# Patient Record
Sex: Male | Born: 1935 | Race: White | Hispanic: No | Smoking: Never smoker
Health system: Southern US, Community
[De-identification: ages and names within clinical notes are randomized; demographics above are authoritative.]

## PROBLEM LIST (undated history)

## (undated) DIAGNOSIS — I509 Heart failure, unspecified: Secondary | ICD-10-CM

## (undated) DIAGNOSIS — I10 Essential (primary) hypertension: Secondary | ICD-10-CM

## (undated) HISTORY — DX: Heart failure, unspecified: I50.9

## (undated) HISTORY — PX: COLON SURGERY: SHX602

## (undated) HISTORY — DX: Essential (primary) hypertension: I10

---

## 2003-08-06 ENCOUNTER — Other Ambulatory Visit: Payer: Self-pay

## 2005-06-11 ENCOUNTER — Inpatient Hospital Stay: Payer: Self-pay | Admitting: Internal Medicine

## 2005-06-11 ENCOUNTER — Other Ambulatory Visit: Payer: Self-pay

## 2006-06-18 ENCOUNTER — Other Ambulatory Visit: Payer: Self-pay

## 2006-06-18 ENCOUNTER — Emergency Department: Payer: Self-pay | Admitting: Emergency Medicine

## 2006-06-19 ENCOUNTER — Other Ambulatory Visit: Payer: Self-pay

## 2006-06-19 ENCOUNTER — Inpatient Hospital Stay: Payer: Self-pay | Admitting: Internal Medicine

## 2006-06-25 ENCOUNTER — Other Ambulatory Visit: Payer: Self-pay

## 2008-07-11 ENCOUNTER — Ambulatory Visit: Payer: Self-pay | Admitting: Internal Medicine

## 2010-05-30 ENCOUNTER — Ambulatory Visit: Payer: Self-pay | Admitting: Otolaryngology

## 2012-06-13 ENCOUNTER — Ambulatory Visit: Payer: Self-pay | Admitting: Cardiology

## 2012-06-13 DIAGNOSIS — I10 Essential (primary) hypertension: Secondary | ICD-10-CM

## 2012-06-13 LAB — BASIC METABOLIC PANEL
BUN: 49 mg/dL — ABNORMAL HIGH (ref 7–18)
Calcium, Total: 8.8 mg/dL (ref 8.5–10.1)
Chloride: 111 mmol/L — ABNORMAL HIGH (ref 98–107)
Creatinine: 1.97 mg/dL — ABNORMAL HIGH (ref 0.60–1.30)
EGFR (African American): 35 — ABNORMAL LOW
EGFR (Non-African Amer.): 30 — ABNORMAL LOW
Glucose: 90 mg/dL (ref 65–99)
Osmolality: 294 (ref 275–301)
Potassium: 4.5 mmol/L (ref 3.5–5.1)
Sodium: 141 mmol/L (ref 136–145)

## 2012-06-13 LAB — CBC WITH DIFFERENTIAL/PLATELET
Basophil #: 0 10*3/uL (ref 0.0–0.1)
Basophil %: 0.5 %
Eosinophil #: 0.4 10*3/uL (ref 0.0–0.7)
HCT: 36.1 % — ABNORMAL LOW (ref 40.0–52.0)
HGB: 12 g/dL — ABNORMAL LOW (ref 13.0–18.0)
Lymphocyte #: 0.7 10*3/uL — ABNORMAL LOW (ref 1.0–3.6)
Lymphocyte %: 8.2 %
MCHC: 33.1 g/dL (ref 32.0–36.0)
MCV: 90 fL (ref 80–100)
Monocyte %: 5.3 %
Neutrophil #: 6.4 10*3/uL (ref 1.4–6.5)
Neutrophil %: 80.4 %
RBC: 4 10*6/uL — ABNORMAL LOW (ref 4.40–5.90)
RDW: 15.2 % — ABNORMAL HIGH (ref 11.5–14.5)

## 2012-06-13 LAB — PROTIME-INR: Prothrombin Time: 12.7 secs (ref 11.5–14.7)

## 2012-06-16 ENCOUNTER — Ambulatory Visit: Payer: Self-pay | Admitting: Cardiology

## 2013-10-28 ENCOUNTER — Inpatient Hospital Stay: Payer: Self-pay | Admitting: Internal Medicine

## 2013-10-28 LAB — COMPREHENSIVE METABOLIC PANEL
ALT: 12 U/L (ref 12–78)
AST: 20 U/L (ref 15–37)
Albumin: 3.3 g/dL — ABNORMAL LOW (ref 3.4–5.0)
Alkaline Phosphatase: 142 U/L — ABNORMAL HIGH
Anion Gap: 7 (ref 7–16)
BILIRUBIN TOTAL: 0.9 mg/dL (ref 0.2–1.0)
BUN: 45 mg/dL — ABNORMAL HIGH (ref 7–18)
Calcium, Total: 8.7 mg/dL (ref 8.5–10.1)
Chloride: 105 mmol/L (ref 98–107)
Co2: 26 mmol/L (ref 21–32)
Creatinine: 2.29 mg/dL — ABNORMAL HIGH (ref 0.60–1.30)
EGFR (Non-African Amer.): 25 — ABNORMAL LOW
GFR CALC AF AMER: 29 — AB
Glucose: 140 mg/dL — ABNORMAL HIGH (ref 65–99)
Osmolality: 290 (ref 275–301)
Potassium: 4.5 mmol/L (ref 3.5–5.1)
SODIUM: 138 mmol/L (ref 136–145)
Total Protein: 7.4 g/dL (ref 6.4–8.2)

## 2013-10-28 LAB — CBC WITH DIFFERENTIAL/PLATELET
Basophil #: 0 10*3/uL (ref 0.0–0.1)
Basophil %: 0.7 %
EOS PCT: 3.1 %
Eosinophil #: 0.2 10*3/uL (ref 0.0–0.7)
HCT: 33.5 % — AB (ref 40.0–52.0)
HGB: 10.5 g/dL — ABNORMAL LOW (ref 13.0–18.0)
LYMPHS PCT: 6.4 %
Lymphocyte #: 0.3 10*3/uL — ABNORMAL LOW (ref 1.0–3.6)
MCH: 28.8 pg (ref 26.0–34.0)
MCHC: 31.4 g/dL — ABNORMAL LOW (ref 32.0–36.0)
MCV: 92 fL (ref 80–100)
Monocyte #: 0.4 x10 3/mm (ref 0.2–1.0)
Monocyte %: 7.2 %
NEUTROS ABS: 4.5 10*3/uL (ref 1.4–6.5)
Neutrophil %: 82.6 %
PLATELETS: 108 10*3/uL — AB (ref 150–440)
RBC: 3.66 10*6/uL — ABNORMAL LOW (ref 4.40–5.90)
RDW: 16.4 % — AB (ref 11.5–14.5)
WBC: 5.4 10*3/uL (ref 3.8–10.6)

## 2013-10-28 LAB — URINALYSIS, COMPLETE
BACTERIA: NONE SEEN
BILIRUBIN, UR: NEGATIVE
Blood: NEGATIVE
Glucose,UR: NEGATIVE mg/dL (ref 0–75)
Hyaline Cast: 3
KETONE: NEGATIVE
Leukocyte Esterase: NEGATIVE
Nitrite: NEGATIVE
Ph: 5 (ref 4.5–8.0)
Protein: NEGATIVE
RBC,UR: 1 /HPF (ref 0–5)
Specific Gravity: 1.012 (ref 1.003–1.030)

## 2013-10-28 LAB — APTT: ACTIVATED PTT: 33.6 s (ref 23.6–35.9)

## 2013-10-28 LAB — PROTIME-INR
INR: 1.3
PROTHROMBIN TIME: 15.6 s — AB (ref 11.5–14.7)

## 2013-10-29 LAB — CBC WITH DIFFERENTIAL/PLATELET
BASOS ABS: 0 10*3/uL (ref 0.0–0.1)
Basophil %: 0.5 %
Eosinophil #: 0 10*3/uL (ref 0.0–0.7)
Eosinophil %: 0.7 %
HCT: 31.7 % — AB (ref 40.0–52.0)
HGB: 10.2 g/dL — ABNORMAL LOW (ref 13.0–18.0)
LYMPHS ABS: 0.3 10*3/uL — AB (ref 1.0–3.6)
Lymphocyte %: 6.9 %
MCH: 29.7 pg (ref 26.0–34.0)
MCHC: 32.3 g/dL (ref 32.0–36.0)
MCV: 92 fL (ref 80–100)
Monocyte #: 0.4 x10 3/mm (ref 0.2–1.0)
Monocyte %: 8.9 %
NEUTROS ABS: 3.8 10*3/uL (ref 1.4–6.5)
NEUTROS PCT: 83 %
Platelet: 95 10*3/uL — ABNORMAL LOW (ref 150–440)
RBC: 3.45 10*6/uL — AB (ref 4.40–5.90)
RDW: 16.6 % — ABNORMAL HIGH (ref 11.5–14.5)
WBC: 4.6 10*3/uL (ref 3.8–10.6)

## 2013-10-29 LAB — BASIC METABOLIC PANEL
Anion Gap: 8 (ref 7–16)
BUN: 38 mg/dL — AB (ref 7–18)
CHLORIDE: 109 mmol/L — AB (ref 98–107)
CO2: 24 mmol/L (ref 21–32)
Calcium, Total: 8.8 mg/dL (ref 8.5–10.1)
Creatinine: 1.68 mg/dL — ABNORMAL HIGH (ref 0.60–1.30)
EGFR (African American): 42 — ABNORMAL LOW
GFR CALC NON AF AMER: 36 — AB
GLUCOSE: 128 mg/dL — AB (ref 65–99)
Osmolality: 292 (ref 275–301)
POTASSIUM: 4.5 mmol/L (ref 3.5–5.1)
Sodium: 141 mmol/L (ref 136–145)

## 2013-10-29 LAB — MAGNESIUM: Magnesium: 1.8 mg/dL

## 2013-10-30 LAB — CBC WITH DIFFERENTIAL/PLATELET
Basophil #: 0 10*3/uL (ref 0.0–0.1)
Basophil %: 0.3 %
EOS ABS: 0 10*3/uL (ref 0.0–0.7)
EOS PCT: 0.3 %
HCT: 23.6 % — ABNORMAL LOW (ref 40.0–52.0)
HGB: 7.6 g/dL — ABNORMAL LOW (ref 13.0–18.0)
LYMPHS ABS: 0.3 10*3/uL — AB (ref 1.0–3.6)
Lymphocyte %: 6.1 %
MCH: 29.7 pg (ref 26.0–34.0)
MCHC: 32.3 g/dL (ref 32.0–36.0)
MCV: 92 fL (ref 80–100)
MONO ABS: 0.6 x10 3/mm (ref 0.2–1.0)
Monocyte %: 10.7 %
Neutrophil #: 4.4 10*3/uL (ref 1.4–6.5)
Neutrophil %: 82.6 %
PLATELETS: 92 10*3/uL — AB (ref 150–440)
RBC: 2.56 10*6/uL — AB (ref 4.40–5.90)
RDW: 15.9 % — AB (ref 11.5–14.5)
WBC: 5.3 10*3/uL (ref 3.8–10.6)

## 2013-10-30 LAB — BASIC METABOLIC PANEL
Anion Gap: 6 — ABNORMAL LOW (ref 7–16)
BUN: 39 mg/dL — AB (ref 7–18)
Calcium, Total: 8.2 mg/dL — ABNORMAL LOW (ref 8.5–10.1)
Chloride: 105 mmol/L (ref 98–107)
Co2: 26 mmol/L (ref 21–32)
Creatinine: 1.92 mg/dL — ABNORMAL HIGH (ref 0.60–1.30)
EGFR (African American): 36 — ABNORMAL LOW
EGFR (Non-African Amer.): 31 — ABNORMAL LOW
Glucose: 158 mg/dL — ABNORMAL HIGH (ref 65–99)
OSMOLALITY: 287 (ref 275–301)
Potassium: 4.5 mmol/L (ref 3.5–5.1)
Sodium: 137 mmol/L (ref 136–145)

## 2013-10-30 LAB — HEMATOCRIT: HCT: 23.2 % — ABNORMAL LOW (ref 40.0–52.0)

## 2013-10-30 LAB — HEMOGLOBIN: HGB: 7.4 g/dL — ABNORMAL LOW (ref 13.0–18.0)

## 2013-10-31 LAB — CBC WITH DIFFERENTIAL/PLATELET
BASOS ABS: 0 10*3/uL (ref 0.0–0.1)
Basophil %: 0.3 %
Eosinophil #: 0.1 10*3/uL (ref 0.0–0.7)
Eosinophil %: 1.8 %
HCT: 24.7 % — ABNORMAL LOW (ref 40.0–52.0)
HGB: 8.3 g/dL — AB (ref 13.0–18.0)
LYMPHS ABS: 0.4 10*3/uL — AB (ref 1.0–3.6)
Lymphocyte %: 6.7 %
MCH: 30.4 pg (ref 26.0–34.0)
MCHC: 33.7 g/dL (ref 32.0–36.0)
MCV: 90 fL (ref 80–100)
MONO ABS: 0.5 x10 3/mm (ref 0.2–1.0)
Monocyte %: 8.5 %
NEUTROS ABS: 4.7 10*3/uL (ref 1.4–6.5)
Neutrophil %: 82.7 %
Platelet: 86 10*3/uL — ABNORMAL LOW (ref 150–440)
RBC: 2.74 10*6/uL — ABNORMAL LOW (ref 4.40–5.90)
RDW: 16.4 % — ABNORMAL HIGH (ref 11.5–14.5)
WBC: 5.7 10*3/uL (ref 3.8–10.6)

## 2013-11-01 LAB — CBC WITH DIFFERENTIAL/PLATELET
Basophil #: 0 10*3/uL (ref 0.0–0.1)
Basophil %: 0.5 %
EOS PCT: 2.1 %
Eosinophil #: 0.1 10*3/uL (ref 0.0–0.7)
HCT: 24.4 % — ABNORMAL LOW (ref 40.0–52.0)
HGB: 8.2 g/dL — AB (ref 13.0–18.0)
Lymphocyte #: 0.3 10*3/uL — ABNORMAL LOW (ref 1.0–3.6)
Lymphocyte %: 6.2 %
MCH: 30.3 pg (ref 26.0–34.0)
MCHC: 33.7 g/dL (ref 32.0–36.0)
MCV: 90 fL (ref 80–100)
Monocyte #: 0.4 x10 3/mm (ref 0.2–1.0)
Monocyte %: 8.1 %
NEUTROS ABS: 4.2 10*3/uL (ref 1.4–6.5)
Neutrophil %: 83.1 %
Platelet: 113 10*3/uL — ABNORMAL LOW (ref 150–440)
RBC: 2.72 10*6/uL — AB (ref 4.40–5.90)
RDW: 16.3 % — AB (ref 11.5–14.5)
WBC: 5 10*3/uL (ref 3.8–10.6)

## 2013-11-01 LAB — BASIC METABOLIC PANEL
Anion Gap: 8 (ref 7–16)
BUN: 56 mg/dL — ABNORMAL HIGH (ref 7–18)
CALCIUM: 8.8 mg/dL (ref 8.5–10.1)
Chloride: 103 mmol/L (ref 98–107)
Co2: 23 mmol/L (ref 21–32)
Creatinine: 2.21 mg/dL — ABNORMAL HIGH (ref 0.60–1.30)
EGFR (Non-African Amer.): 26 — ABNORMAL LOW
GFR CALC AF AMER: 30 — AB
GLUCOSE: 131 mg/dL — AB (ref 65–99)
OSMOLALITY: 286 (ref 275–301)
POTASSIUM: 4.4 mmol/L (ref 3.5–5.1)
SODIUM: 134 mmol/L — AB (ref 136–145)

## 2013-12-22 ENCOUNTER — Emergency Department: Payer: Self-pay | Admitting: Emergency Medicine

## 2014-10-12 NOTE — Op Note (Signed)
PATIENT NAME:  Philip Newton, [name] MR#:  [id number] DATE OF BIRTH:  [birth year]  DATE OF PROCEDURE:  06/16/2012  PRIMARY CARE PHYSICIAN: Dr. Dan HumphreysWalker.   PREPROCEDURE DIAGNOSIS: Sick sinus syndrome and elective replacement indication.   PROCEDURE: Dual-chamber pacemaker generator change out.   POSTOPERATIVE DIAGNOSIS: Atrial pacing with ventricular sensing.    INDICATION: The patient is an 81 year old gentleman with known history of dual-chamber pacemaker for sick sinus syndrome and sinus bradycardia. Recent pacemaker interrogation has shown the pacemaker was at elective replacement indication.    Procedure: Risks, benefits, and alternatives of the pacemaker generator change out were explained to the patient and informed written consent was obtained.  He was brought to the operating room in a fasting state.  The left pectoral region was prepped and draped in the usual sterile manner.  Anesthesia was obtained with 1% Xylocaine locally.  A 6-cm incision was performed over the left pectoral region.  The old pacemaker generator was retrieved by electrocautery and blunt dissection.  The leads were disconnected and interrogated and connected to a new dual-chamber pacemaker generator (Medtronic Adapta [id number]).  The pacemaker pocket was irrigated with genatmycin  solution.  New pacemaker generator was positioned in the pocket.  The pocket was closed with 2-0 and 4-0 Vicryl, respectively.  Steri-Strips and pressure dressing were applied.   ____________________________ Marcina MillardAlexander Merdis Snodgrass, MD ap:th D: 06/16/2012 13:47:33 ET T: 06/16/2012 22:03:22 ET JOB#: [id number]  cc: Marcina MillardAlexander Kiani Wurtzel, MD, <Dictator> Marcina MillardALEXANDER Nikea Settle MD ELECTRONICALLY SIGNED 06/22/2012 11:35

## 2014-10-13 NOTE — H&P (Signed)
PATIENT NAME:  Philip Newton, Philip Newton MR#:  [id number] DATE OF BIRTH:  Nov 26, 1925  DATE OF ADMISSION:  10/28/2013  PRIMARY CARE PHYSICIAN: John B. Danne HarborWalker III, MD   REFERRING PHYSICIAN: Dr. Zenda AlpersWebster.   CHIEF COMPLAINT: Fall.   HISTORY OF PRESENT ILLNESS: The patient is an 81 year old pleasant Caucasian male with past medical history of coronary artery disease status post CABG, hypertension, diabetes mellitus, colon cancer status post resection of the colon, who is brought into the ER via EMS after he sustained a fall. The patient tripped on his shoe lace and he fell and was complaining of right hip pain. The patient called his son who lives next door after he fell down. As the patient was having hip pain, the son called EMS and they have brought him into the ER. X-ray has revealed right hip fracture. Orthopedic consult was placed to Dr. Martha ClanKrasinski regarding the hip fracture and hospitalist team is called to admit the patient. During my examination, the patient is resting comfortably. Denies any chest pain, shortness of breath. Denies any dizziness or loss of consciousness. He reports that he fell because he tripped on the shoe lace. He was seen by Dr. Gwen PoundsKowalski recently and next appointment is on the 12th of this month. Hip pain is well-controlled the pain medicine. No family members at bedside. Denies any other complaints.   PAST MEDICAL HISTORY: Coronary artery disease status post CABG, colon cancer status post resection, hypertension, diabetes mellitus, arthritis, gout.   PAST SURGICAL HISTORY: Pacemaker placement, colon cancer resection, coronary artery bypass grafting in 2007.  ALLERGIES: ALLERGIC TO CORTISONE, ACE INHIBITORS, ALLOPURINOL, ASPIRIN, ZOCOR.   PSYCHOSOCIAL HISTORY: Lives at home, lives alone. No history of smoking, alcohol or illicit drug usage. Son lives next door.   FAMILY HISTORY: Diabetes runs in his family.   HOME MEDICATIONS: Imdur 30 mg p.o. once daily, hydralazine 25 mg p.o.  b.i.d., furosemide 20 mg p.o. once daily noontime, Coreg 25 mg p.o. b.i.d.   REVIEW OF SYSTEMS: CONSTITUTIONAL: Denies any fever, fatigue, weakness.  EYES: Denies blurry vision, double vision, glaucoma, cataracts.  EARS, NOSE, THROAT: Denies epistaxis, discharge, snoring, postnasal drip.  RESPIRATORY: Denies cough, COPD, no hemoptysis.  CARDIOVASCULAR: No chest pain, palpitations, syncope.  GASTROINTESTINAL: Denies nausea, vomiting, diarrhea, abdominal pain.  GENITOURINARY: Denies hematuria, dysuria. ENDOCRINE: Denies polyuria, nocturia, thyroid problems. Has diabetes mellitus.  HEMATOLOGIC/LYMPHATIC: No anemia, easy bruising, bleeding.  INTEGUMENTARY: No acne, rash, lesions.  MUSCULOSKELETAL: Complaining of right hip pain after fall prior to his ER arrival. Has history of gout.  NEUROLOGIC: Denies any vertigo, ataxia, dementia, headache.  PSYCHIATRIC: No ADD, OCD, insomnia, anxiety.   PHYSICAL EXAMINATION: VITAL SIGNS: Temperature 98.1, pulse 82, respirations 16, blood pressure 146/83, pulse oximetry 96%.  GENERAL APPEARANCE: Not in acute distress. Moderately built and nourished.  HEENT: Normocephalic, atraumatic. Pupils are equally reacting to light and accommodation icterus. No conjunctival injection. No sinus tenderness. No postnasal drip. Moist mucous membranes.  NECK: Supple. No JVD. No thyromegaly. Range of motion is intact.  LUNGS: Clear to auscultation bilaterally. No accessory muscle use and no anterior chest wall tenderness on palpation.  CARDIAC: S1, S2 normal. Regular rate and rhythm. Positive murmur.  GASTROINTESTINAL: Soft. Bowel sounds are positive in all 4 quadrants. Nontender, nondistended. No hepatosplenomegaly. No masses felt.  NEUROLOGIC: Awake, alert, oriented x3. Following all verbal commands. Motor and sensory are grossly intact except the right hip area which is tender. Reflexes are 2+.  MUSCULOSKELETAL: Right hip area is externally rotated, tender to touch.  Other  joints are intact. No acute exacerbation of gout.  SKIN: Warm to touch. Normal turgor. No rashes. No lesions.  EXTREMITIES: Trace edema. No cyanosis. No clubbing.  SKIN: Warm to warm to touch. Normal turgor. No rashes. No lesions.  PSYCHIATRIC: Normal mood and affect.   LABORATORIES AND IMAGING STUDIES: Glucose 140, BUN 45, creatinine 2.29, sodium, potassium and chloride are normal. GFR 29, anion gap and serum osmolality are normal. LFTs: Albumin 3.3, alkaline phosphatase 142. The rest of the LFTs are normal. WBC 5.4, hemoglobin 10.5, hematocrit 33.5, platelets are 108,000. A 12-lead EKG: Paced rhythm at 69, right bundle branch block. Nonspecific ST-T-wave changes. Chest x-ray, portable: Stable cardiomegaly without evidence of failure. No acute cardiopulmonary abnormality. Right hip x-ray: Acute intertrochanteric right femur fracture.   ASSESSMENT AND PLAN: An 82 year old Caucasian male presenting to the ER with a chief complaint of right hip pain after he sustained a fall. Will be admitted with the following assessment and plan:  1. Right hip fracture. The patient is kept n.p.o. except for medications. Pain management by orthopedics, Dr. Martha Clan. Cardiology consult is placed to Dr. Gwen Pounds for preoperative clearance from cardiac standpoint, which is pending at this time.  2. History of coronary artery disease, status post CABG. The patient will be monitored on telemetry, continue Imdur and Coreg. Cardiology consult is placed to Dr. Gwen Pounds regarding preoperative clearance from cardiology standpoint which is pending.  3. Diabetes mellitus. Continue sliding scale insulin as the patient is n.p.o.  4. Hypertension. Resume home medications including hydralazine and Coreg and up-titrate on an as-needed basis.  5. History of colon cancer status post resection. Currently under remission.  6. Will provide gastrointestinal prophylaxis.  7. Deep vein thrombosis prophylaxis with SCDs.  8. He is full code.  Son is the medical power of attorney.   Diagnosis and plan of care was discussed in detail with the patient. He is aware of the plan.   TOTAL TIME SPENT ON ADMISSION: 50 minutes.   Transfer the patient to Dr. Yates Decamp from [facility] in a.m.     ____________________________ Ramonita Lab, MD ag:lm D: 10/28/2013 04:05:10 ET T: 10/28/2013 05:44:25 ET JOB#: [id number]  cc: Ramonita Lab, MD, <Dictator> Ramonita Lab MD ELECTRONICALLY SIGNED 10/29/2013 4:45

## 2014-10-13 NOTE — Op Note (Signed)
PATIENT NAME:  Philip Newton, Philip Newton MR#:  [id number] DATE OF BIRTH:  11/20/1925  DATE OF PROCEDURE:  10/29/2013  POSTOPERATIVE DIAGNOSIS: Right intertrochanteric hip fracture.   POSTOPERATIVE DIAGNOSIS: Right intertrochanteric hip fracture.   PROCEDURE: Intramedullary fixation for right intertrochanteric hip fracture.   SURGEON: Juanell Fairly, MD   ANESTHESIA: General.   ESTIMATED BLOOD LOSS: 50 mL.   COMPLICATIONS: None.   SPECIMENS: None.   INDICATIONS FOR PROCEDURE: The patient is an 81 year old male who sustained a mechanical fall at home fracturing his right hip. Given his ambulatory status at baseline and the degree of injury, I recommended intramedullary fixation for the right intertrochanteric hip fracture. I discussed the risks and benefits of surgery with the patient and his son prior to the operation. They understand the risks include infection, bleeding, nerve or blood vessel injury, persistent hip pain, change in lower extremity rotation, leg length discrepancy and hardware failure, screw cut-out, and the need for further surgery. Medical complications include, but are not limited to, DVT and pulmonary embolism, myocardial infarction, stroke, pneumonia, respiratory failure, and death.   DESCRIPTION OF PROCEDURE: The patient was marked with the word yes over the right hip according to the hospital's right site protocol. He was brought to the operating room where he underwent general anesthesia with an LMA. He was then positioned supine on a fracture table. The right leg was placed in a leg holder and the left leg placed in a left hemi-lithotomy position. All bony prominences were adequately padded. A timeout was performed to verify the patient's name, date of birth, medical record number, correct site of surgery, and correct procedure to be performed. It was also used to verify the patient received antibiotics and that all appropriate instruments, implants, and radiographic studies  were available in the room. Once all in attendance were in agreement, the case began.   AP and lateral images of the fracture were taken using C-arm. Traction and external rotation were then applied. The fracture reduction was confirmed on AP and lateral C-arm images. Once the fracture was well aligned, the patient was prepped and draped in a sterile fashion. The proposed incisions were based upon bony landmarks and drawn out with a surgical marker. The landmarks were confirmed using C-arm.   A #10 blade was used to make a lateral incision in line with the femur, superior to the greater trochanter. The deep fascia was then incised with a deep #10 blade. The abductor muscle was bluntly split in line with its fibers to allow for palpation of the tip of the greater trochanter. A threaded guide pin was then advanced into the proximal femur. Its position was confirmed on AP and lateral C-arm images. This was then overdrilled using a 15.5 mm proximal reamer. A long ball-tip guidewire was then advanced into the intramedullary canal. An 11 x 180 short nail was then advanced over this ball-tip guidewire into position. The ball-tip guidewire was then removed. A drill sleeve for the lag screw was then placed alongside the femur. The incision was made in the skin and then the deep fascia to allow for approximation with the lateral cortex of the femur. This guide pin was then advanced across the fracture and into the subcortical bone of the femoral head. Its position was confirmed on AP and lateral C-arm images. This was measured for depth and measured to be 110 mm in length. The lag screw drill was then set for 110 mm in length and advanced into position. A 110 mm  lag screw was then advanced by hand over the guide pin into position. Its final position again confirmed on AP and lateral C-arm images. A tip apex distance of less than 25 mm was achieved. The traction was then let off the right leg and the set screw was  tightened into position and then loosened a quarter turn to allow for compression at the fracture site after compression at the fracture had been achieved using the compression wheel.  Attention was then turned to the placement of the distal interlocking screw. The distal interlocking screw guide was placed through the Affixus nail guide handle. Another stab incision was made to allow for the guide sleeve to approximate the lateral cortex of the femur. This drill was then used to create a bicortical hole. A depth gauge was used to measure its depth.  A size 40 mm distal interlocking screw was used. The guide handle for the Affixus nail was then removed. Final C-arm images of the construct were taken. All wounds were copiously irrigated. The deep fascia of the proximal incision which is approximately 3 cm in length was closed with an interrupted 0 Vicryl. The subcutaneous tissue of all 3 incisions was closed with 2-0 Vicryl and the skin approximated with staples. A dry sterile dressing was applied. The patient was then awoken and transferred to the PACU in stable condition. I was scrubbed and present for the entire case and all sharp and instrument counts were correct at the conclusion of the case. I spoke with the patient's son by phone as he is waiting in the patient's room down on the orthopedic floor  to let him know the case had gone without complication and the patient was stable in the recovery room.    ____________________________ Philip DevoidKevin L. Beonka Amesquita, MD klk:dd D: 10/29/2013 11:48:28 ET T: 10/29/2013 23:33:50 ET JOB#: [id number]  cc: Philip DevoidKevin L. Dex Blakely, MD, <Dictator> Philip DevoidKEVIN L Larnce Schnackenberg MD ELECTRONICALLY SIGNED 11/07/2013 7:53

## 2014-10-13 NOTE — Discharge Summary (Signed)
Dates of Admission and Diagnosis:  Date of Admission 28-Oct-2013   Date of Discharge 02-Nov-2013   Admitting Diagnosis acute intertrochanteric fracture of right femur   Final Diagnosis acute intertrochanteric fracture of right femur, s/p intramedulary fixation on 10/29/13, Anemia, CKD, DM, HTN. CAD, s/p CABG, s/p pacemaker   Discharge Diagnosis 1 acute intertrochanteric fracture of right femur, s/p intramedulary fixation on 10/29/13   2 Anemia   3 Chronic Kidney disease   4 Diabetes   5 CAD, s/p CABG   6 HTN    Chief Complaint/History of Present Illness 81 year old male presented with hip pain after he tripped and fell at home. X ray in ED revealed acute intertrochanteric fracture of right femur.   Allergies:  Allopurinol: Unknown  Zocor: Other  Aspirin Adult Low Strength: Other  Ace Inhibitors: Unknown  Cortisone: Unknown    Hepatic:  09-May-15 02:17   Bilirubin, Total 0.9  Alkaline Phosphatase  142 (45-117 NOTE: New Reference Range 05/12/13)  SGPT (ALT) 12  SGOT (AST) 20  Total Protein, Serum 7.4  Albumin, Serum  3.3  Routine Chem:  09-May-15 02:17   Glucose, Serum  140  BUN  45  Creatinine (comp)  2.29  Sodium, Serum 138  Potassium, Serum 4.5  Chloride, Serum 105  CO2, Serum 26  Calcium (Total), Serum 8.7  Anion Gap 7  Osmolality (calc) 290  eGFR (African American)  29  eGFR (Non-African American)  25 (eGFR values <42m/min/1.73 m2 may be an indication of chronic kidney disease (CKD). Calculated eGFR is useful in patients with stable renal function. The eGFR calculation will not be reliable in acutely ill patients when serum creatinine is changing rapidly. It is not useful in  patients on dialysis. The eGFR calculation may not be applicable to patients at the low and high extremes of body sizes, pregnant women, and vegetarians.)  10-May-15 04:12   Creatinine (comp)  1.68  eGFR (Non-African American)  36 (eGFR values <644mmin/1.73 m2 may be an  indication of chronic kidney disease (CKD). Calculated eGFR is useful in patients with stable renal function. The eGFR calculation will not be reliable in acutely ill patients when serum creatinine is changing rapidly. It is not useful in  patients on dialysis. The eGFR calculation may not be applicable to patients at the low and high extremes of body sizes, pregnant women, and vegetarians.)  11-May-15 04:18   Creatinine (comp)  1.92  eGFR (Non-African American)  31 (eGFR values <6035min/1.73 m2 may be an indication of chronic kidney disease (CKD). Calculated eGFR is useful in patients with stable renal function. The eGFR calculation will not be reliable in acutely ill patients when serum creatinine is changing rapidly. It is not useful in  patients on dialysis. The eGFR calculation may not be applicable to patients at the low and high extremes of body sizes, pregnant women, and vegetarians.)  13-May-15 06:12   Glucose, Serum  131  BUN  56  Creatinine (comp)  2.21  Sodium, Serum  134  Potassium, Serum 4.4  Chloride, Serum 103  CO2, Serum 23  Calcium (Total), Serum 8.8  Anion Gap 8  Osmolality (calc) 286  eGFR (African American)  30  eGFR (Non-African American)  26 (eGFR values <57m8mn/1.73 m2 may be an indication of chronic kidney disease (CKD). Calculated eGFR is useful in patients with stable renal function. The eGFR calculation will not be reliable in acutely ill patients when serum creatinine is changing rapidly. It is not useful in  patients  on dialysis. The eGFR calculation may not be applicable to patients at the low and high extremes of body sizes, pregnant women, and vegetarians.)  Routine Hem:  09-May-15 02:17   WBC (CBC) 5.4  RBC (CBC)  3.66  Hemoglobin (CBC)  10.5  Hematocrit (CBC)  33.5  Platelet Count (CBC)  108  MCV 92  MCH 28.8  MCHC  31.4  RDW  16.4  Neutrophil % 82.6  Lymphocyte % 6.4  Monocyte % 7.2  Eosinophil % 3.1  Basophil % 0.7   Neutrophil # 4.5  Lymphocyte #  0.3  Monocyte # 0.4  Eosinophil # 0.2  Basophil # 0.0 (Result(s) reported on 28 Oct 2013 at 02:35AM.)  10-May-15 04:12   Hemoglobin (CBC)  10.2  Hematocrit (CBC)  31.7  11-May-15 04:18   Hemoglobin (CBC)  7.6  Hematocrit (CBC)  23.6    07:33   Hemoglobin (CBC)  7.4 (Result(s) reported on 30 Oct 2013 at 07:45AM.)  Hematocrit (CBC)  23.2 (Result(s) reported on 30 Oct 2013 at 07:45AM.)  12-May-15 05:51   Hemoglobin (CBC)  8.3  Hematocrit (CBC)  24.7  13-May-15 06:12   WBC (CBC) 5.0  RBC (CBC)  2.72  Hemoglobin (CBC)  8.2  Hematocrit (CBC)  24.4  Platelet Count (CBC)  113  MCV 90  MCH 30.3  MCHC 33.7  RDW  16.3  Neutrophil % 83.1  Lymphocyte % 6.2  Monocyte % 8.1  Eosinophil % 2.1  Basophil % 0.5  Neutrophil # 4.2  Lymphocyte #  0.3  Monocyte # 0.4  Eosinophil # 0.1  Basophil # 0.0 (Result(s) reported on 01 Nov 2013 at 08:30AM.)   PERTINENT RADIOLOGY STUDIES: XRay:    09-May-15 01:57, Hip Right Complete  Hip Right Complete   REASON FOR EXAM:    fall  COMMENTS:       PROCEDURE: DXR - DXR HIP RIGHT COMPLETE  - Oct 28 2013  1:57AM     CLINICAL DATA:  Right hip pain after fall    EXAM:  RIGHT HIP - COMPLETE 2+ VIEW    COMPARISON:  None.    FINDINGS:  Acute intertrochanteric right femur fracture. There is distraction  of fracture fragments and mild posterior impaction. The right  femoral head remains located.    No pelvic ring fracture. No proximal left femur fracture or left hip  dislocation. Extensive atherosclerosis.     IMPRESSION:  Acute intertrochanteric right femur fracture.      Electronically Signed    By: Jorje Guild M.D.    On: 10/28/2013 02:07         Verified By: Gilford Silvius, M.D.,    10-May-15 10:05, Femur Right  Femur Right   REASON FOR EXAM:    Fracture reduction/fixation  COMMENTS:       PROCEDURE: DXR - DXR FEMUR RIGHT  - Oct 29 2013 10:05AM     CLINICAL DATA:  Intertrochanteric  fracture    EXAM:  DG C-ARM 61-120 MIN; RIGHT FEMUR - 2 VIEW    :  COMPARISON:    10/28/2013  FINDINGS:  Four intraoperative fluoroscopic spot images document placement of  IM rod with interlocking sliding screw across the IT fracture. There  is also a distal interlocking screw.     IMPRESSION:  Internal fixation of intertrochanteric fracture      Electronically Signed    By: Arne Cleveland M.D.    On: 10/29/2013 10:12  Verified By: Kandis Cocking, M.D.,    10-May-15 11:21, Hip Right One View  Hip Right One View   REASON FOR EXAM:    post op  COMMENTS:   Bedside (portable):Y    PROCEDURE: DXR - DXR HIP RIGHT ONE VIEW  - Oct 29 2013 11:21AM     CLINICAL DATA:  rt hip ORIF / post op    EXAM:  RIGHT HIP - 1 VIEW    COMPARISON:  DG PELVIS 1-2V dated 10/29/2013    FINDINGS:  Patient is status post open reduction internal fixation right hip.  Hardware and native osseous structures intact. Near anatomical  alignment. Postsurgical changes within the soft tissues.     IMPRESSION:  Patient is status post ORIF      Electronically Signed    By: Margaree Mackintosh M.D.    On: 10/29/2013 11:51         Verified By: Mikki Santee, M.D., MD   Pertinent Past History:  Pertinent Past History Hypertension CAD, s/p CABG, s/p pacemaker Diabetes Chronic kidney disease   Hospital Course:  Hospital Course Patient was admitted for acute intertrochanteric fracture of right femur. Patient underwent intramedulary fixation on 10/29/13. Hemoglobin dropped under 8 and patient received transfusion with PRBCs. H/H remained stable after transfusion. Patient received Ancef & Clindamycin.  Cardiac medications were continued and patient was followed by cardiology during his stay. He received sliding scale insulin coverage for diabetes. Chronic kidney disease: creatinine remained stable, 2.2 prior to discharge. He received physical therapy and ambulated with a walker prior to  discharge. Exam: NAD. HENT: no icterus, no JVD. Chest: bilateral clear to auscultation. CVS: RRR, no tachycardia. Abdomen: soft, nontender, +BS. Ext: no ankle edema. Neuro: Alert, oriented.   Condition on Discharge Stable   DISCHARGE INSTRUCTIONS HOME MEDS:  Medication Reconciliation: Patient's Home Medications at Discharge:     Medication Instructions  carvedilol 25 mg oral tablet  1  orally 2 times a day    furosemide 20 mg oral tablet  1  orally once a day at noon   imdur 30 mg oral tablet, extended release  1 tab(s) orally once a day (in the morning)   hydralazine 25 mg oral tablet   orally 2 times a day   acetaminophen 325 mg oral tablet  2 tab(s) orally every 4 hours, As needed, -for temperature >100.5   magnesium hydroxide 8% oral suspension  30 milliliter(s) orally 2 times a day, As needed, constipation   enoxaparin  30 milligram(s) subcutaneous every 24 hours   calcium-vitamin d 500 mg-200 intl units oral tablet  1 tab(s) orally 2 times a day (with meals)   docusate sodium 100 mg oral capsule  1 cap(s) orally 2 times a day   ferrous sulfate 325 mg (65 mg elemental iron) oral tablet  1 tab(s) orally 2 times a day (with meals)   insulin aspart 100 units/ml subcutaneous solution   subcutaneous      Physician's Instructions:  Diet Low Sodium  Renal Diet   Activity Limitations As tolerated   Return to Work Not Applicable   Time frame for Follow Up Appointment 1-2 weeks  after rehab, Dr. Gilford Rile   Time frame for Follow Up Appointment 1-2 weeks  Ortho   Time frame for Follow Up Appointment 1-2 weeks  Nephrologist, Dr. Candiss Norse   Electronic Signatures: Glendon Axe (MD)  (Signed 14-May-15 13:55)  Authored: ADMISSION DATE AND DIAGNOSIS, CHIEF COMPLAINT/HPI, Allergies, PERTINENT LABS, PERTINENT RADIOLOGY STUDIES, PERTINENT PAST  HISTORY, HOSPITAL COURSE, DISCHARGE INSTRUCTIONS HOME MEDS, PATIENT INSTRUCTIONS   Last Updated: 14-May-15 13:55 by Glendon Axe (MD)

## 2014-10-13 NOTE — Consult Note (Signed)
PATIENT NAME:  Philip Newton, Philip Newton MR#:  [id number] DATE OF BIRTH:  1926/04/15  DATE OF CONSULTATION:  10/28/2013  REFERRING PHYSICIAN:  Dr. Martha ClanKrasinski CONSULTING PHYSICIAN:  Lamar BlinksBruce J. Janyth Riera, MD  REASON FOR CONSULTATION:  Left ventricular systolic dysfunction, valvular heart disease with significant shortness of breath.   CHIEF COMPLAINT:  "I hurt myself in my leg."   HISTORY OF PRESENT ILLNESS:  This is an 81 year old male with severe dilated cardiomyopathy, coronary artery disease status post coronary artery bypass graft, diabetes mellitus, hypertension, hyperlipidemia, chronic kidney disease, having acute fall and injury.  The patient has had a trip and fall and broke his femoral head and needs surgical intervention.  He has had a recent echocardiogram showing severe LV systolic dysfunction with posterior lateral akinesis and ejection fraction of 25% with moderate mitral and moderate tricuspid regurgitation.  The patient has had appropriate medications including Lasix and carvedilol for further issues and has not had any current issues, progressive congestive heart failure or other concerns of angina.  He does have mild shortness of breath with physical activity, relieved by rest, stable over the last many months consistent with stable systolic dysfunction, congestive heart failure-type symptoms and LV systolic dysfunction.  The patient has had an EKG showing normal sinus rhythm with occasional periventricular contractions and lateral T wave inversion of unknown significance.  The patient has had diabetes, hypertension, hyperlipidemia and chronic kidney disease with a creatinine of 2.2, stable at this time, on appropriate treatment.  There has been no evidence of progression of these issues.   REVIEW OF SYSTEMS:  The remainder review of systems negative for vision change, ringing in the ears, hearing loss, cough, congestion, heartburn, nausea, vomiting, diarrhea, bloody stools, stomach pain,  extremity pain, leg weakness, cramping of the buttocks, known blood clots, headaches, blackouts, dizzy spells, nosebleeds, congestion, trouble swallowing, frequent urination, urination at night, muscle weakness, numbness, anxiety, depression, skin lesions or skin rashes.   PAST MEDICAL HISTORY: 1.  Coronary artery disease, status post coronary artery bypass graft.  2.  Diabetes.  3.  Hypertension.  4.  Hyperlipidemia.  5.  Peripheral vascular disease.  6.  Chronic kidney disease.  7.  Cardiomyopathy.   FAMILY HISTORY:  There are family members with early onset of cardiovascular disease in brother and mother.   SOCIAL HISTORY:  Currently denies alcohol or tobacco use.   ALLERGIES:  AS LISTED.   MEDICATIONS:  As listed.   PHYSICAL EXAMINATION: VITAL SIGNS:  Blood pressure 122/68 bilaterally, heart rate 74 upright, reclining, and slightly irregular.  GENERAL:  He is a well-appearing elderly male in no acute distress.  HEAD, EYES, EARS, NOSE, THROAT:  No icterus, thyromegaly, ulcers, hemorrhage, or xanthelasma.  CARDIOVASCULAR:  Slightly irregular with normal S1, S2 with a 3 out of 6 apical murmur consistent with mitral regurgitation.  PMI is inferiorly displaced.  Carotid upstroke normal without bruit.  Jugular venous pressure is normal.  LUNGS:  Clear to auscultation with normal respirations.  ABDOMEN:  Soft, nontender, without hepatosplenomegaly or masses.  Abdominal aorta is normal size without bruit.  EXTREMITIES:  2+ radial, trace femoral and dorsal pedal pulses with trace lower extremity edema.  No cyanosis, clubbing or ulcers.  NEUROLOGIC:  He is oriented to time, place, and person, with normal mood and affect.   ASSESSMENT:  An 81 year old male with peripheral vascular disease, chronic kidney disease, hypertension, diabetes, coronary artery disease status post coronary artery bypass graft and stable dilated cardiomyopathy at moderate risk for cardiovascular complication  due to  multiple medical problems, although all stable at this time at lowest risk possible on appropriate medication management.   RECOMMENDATIONS: 1.  Continue carvedilol for further cardiomyopathy and heart rate control as well as blood pressure control.  2.  No further cardiac diagnostics at this time due to no evidence of congestive heart failure and/or angina.  3.  Proceed to surgical intervention of femoral head due to the severe pain and necessity, although watching closely for hypertension, tachycardia and heart failure.  4.  No restrictions to rehabilitation.  5.  Lasix for lower extremity edema and/or heart failure in the future.  6.  Further treatment options after above.     ____________________________ Lamar Blinks, MD bjk:ea D: 10/29/2013 05:48:00 ET T: 10/29/2013 06:28:58 ET JOB#: [id number]  cc: Lamar Blinks, MD, <Dictator> Lamar Blinks MD ELECTRONICALLY SIGNED 10/29/2013 8:02

## 2014-12-09 ENCOUNTER — Other Ambulatory Visit: Payer: Self-pay | Admitting: Internal Medicine

## 2015-10-31 IMAGING — CR DG RIBS 2V*L*
1 series · 5 of 5 positions shown · non-contrast
Comparison: Chest x-ray 10/28/2013

CLINICAL DATA: Fell.  Rib pain

EXAM:
LEFT RIBS - 2 VIEW

[Series 2: t chest supine · 0.14mm/px · 5 of 5 slices shown]
[im 1/5]
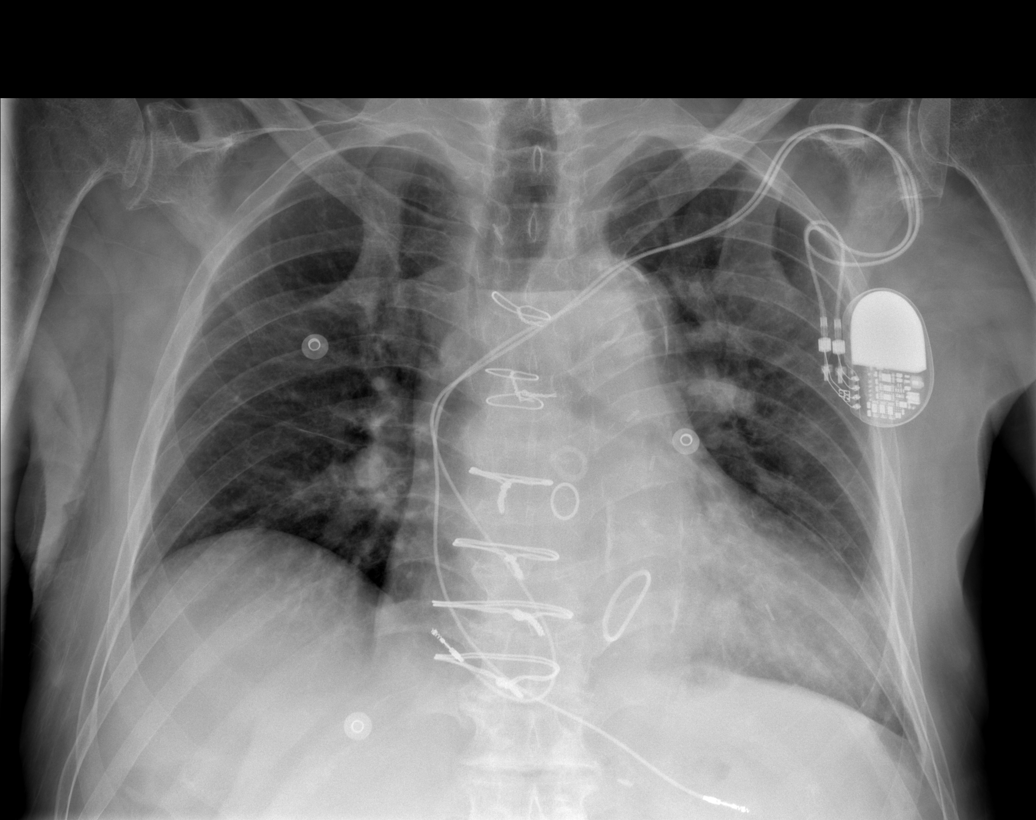
[im 2/5]
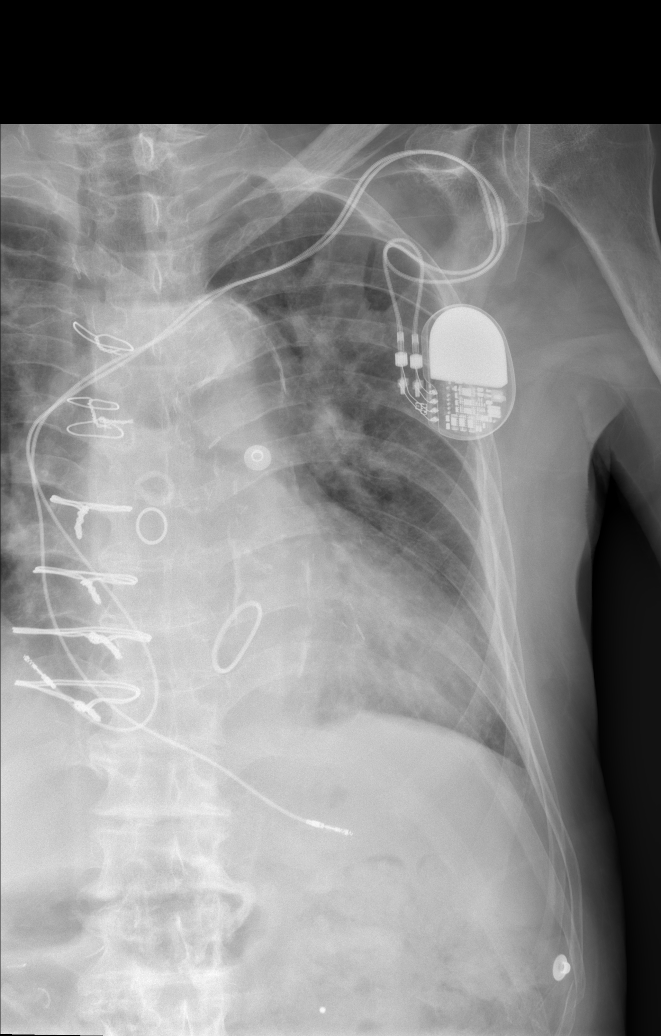
[im 3/5]
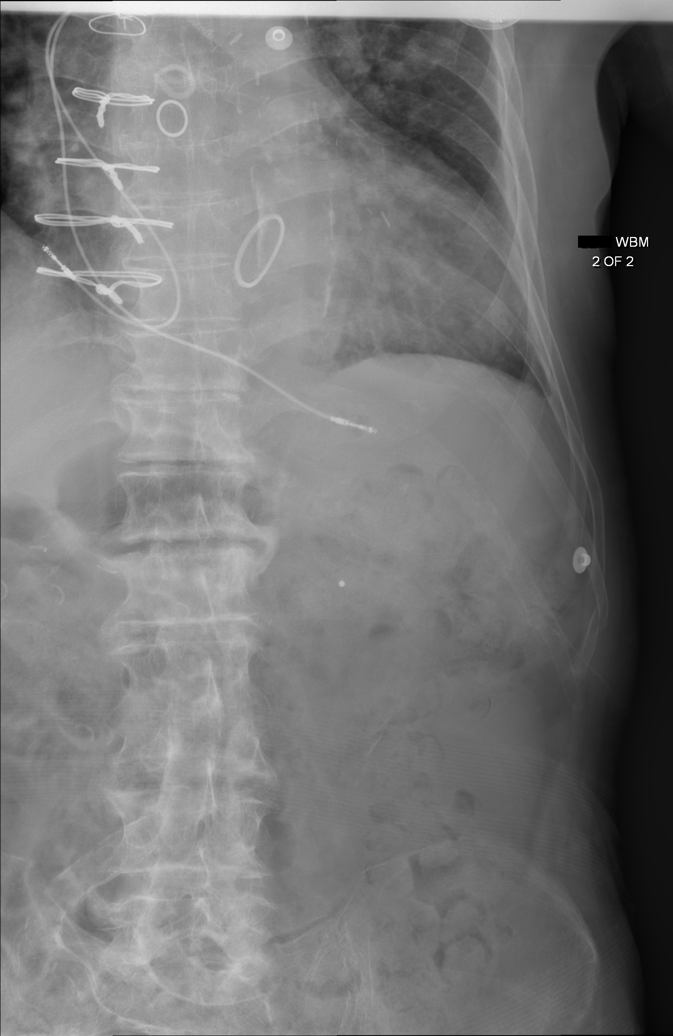
[im 4/5]
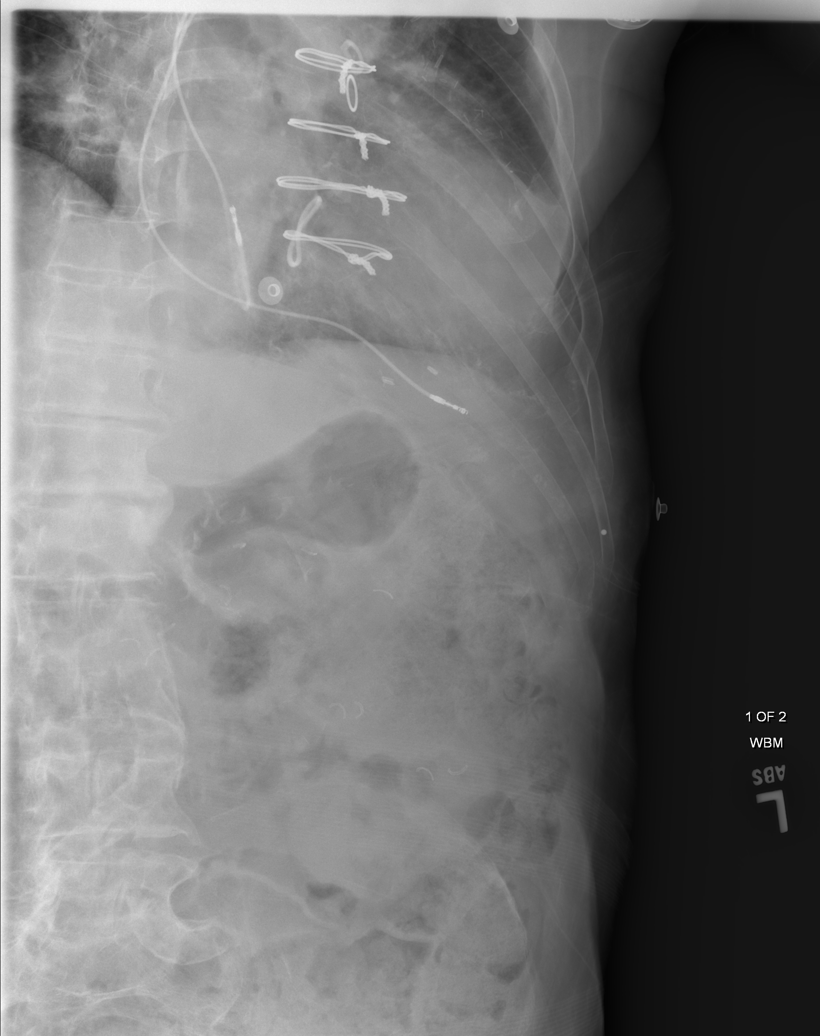
[im 5/5]
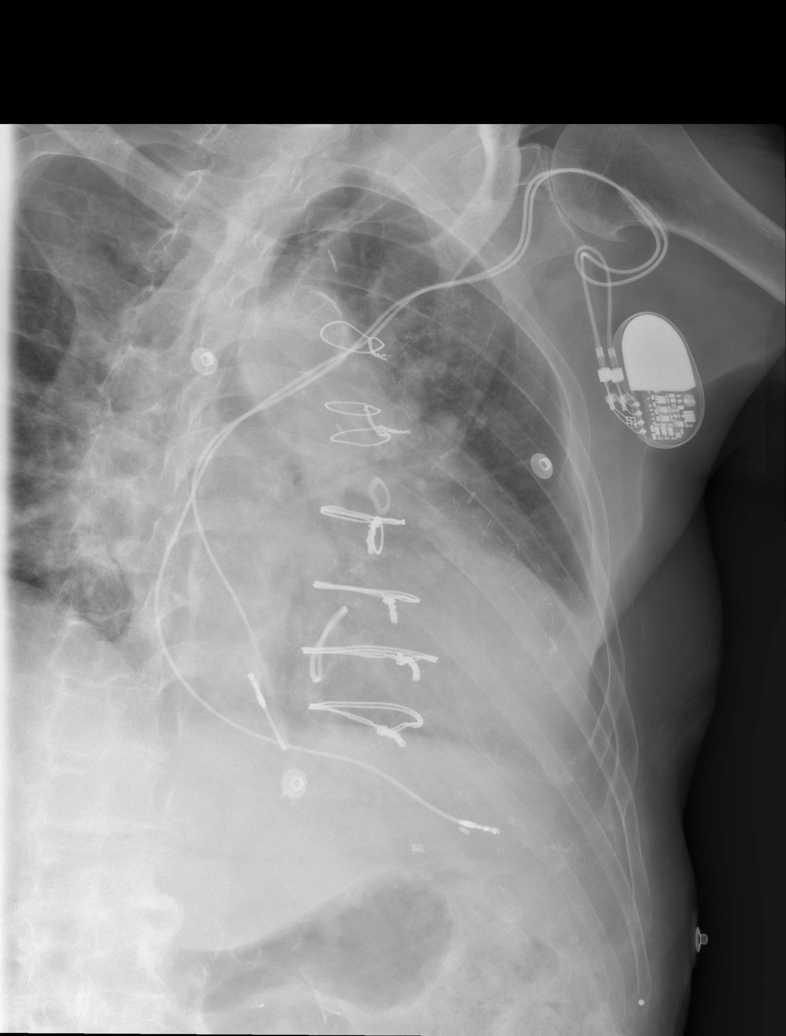

[5 of 5 positions shown; findings below may reference images not displayed]

FINDINGS: The heart is enlarged but stable. There is tortuosity, ectasia and
calcification of the thoracic aorta. Stable surgical changes from
bypass surgery and mitral valve replacement surgery. The pacer wires
are stable. The lungs are clear.

Dedicated views of the left ribs demonstrate a displaced left tenth
rib fracture anteriorly. No pneumothorax.
IMPRESSION: Displaced left tenth anterior rib fracture.

No acute cardiopulmonary findings.

## 2015-10-31 IMAGING — CT CT HEAD WITHOUT CONTRAST
1 series · 16 of 30 positions shown, 20 images · non-contrast
Comparison: None.

CLINICAL DATA: Patient fell at home

EXAM:
CT HEAD WITHOUT CONTRAST
TECHNIQUE: Contiguous axial images were obtained from the base of the skull
through the vertex without intravenous contrast.

[Series 2: head wo · axial · 0.42mm/px · z∈[+386,+516]mm · 16 of 32 slices shown, 20 images]
[im 2/32  brain]
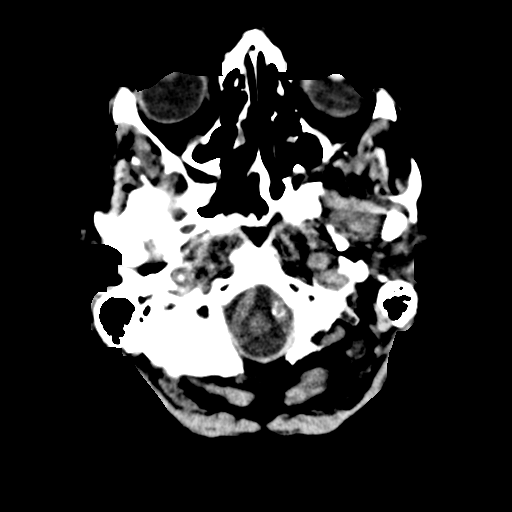
[im 2/32  bone]
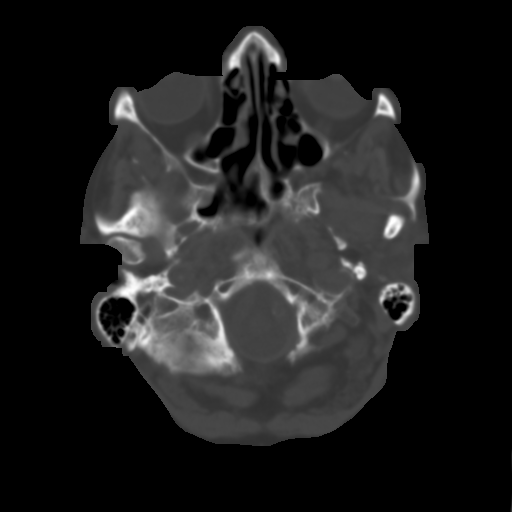
[im 4/32  brain]
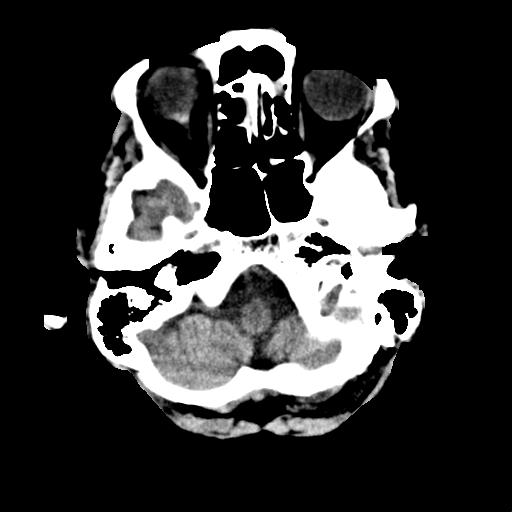
[im 6/32  brain]
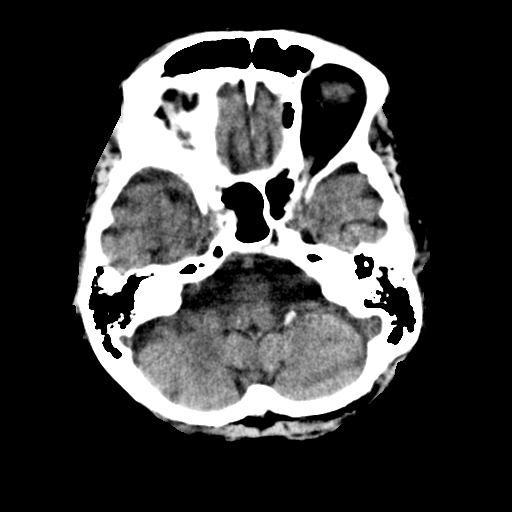
[im 8/32  brain]
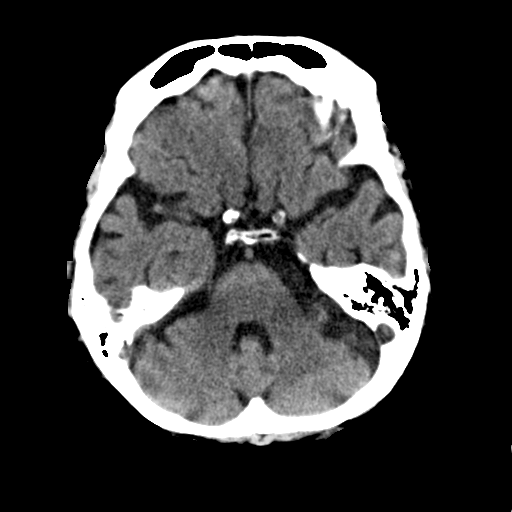
[im 9/32  brain]
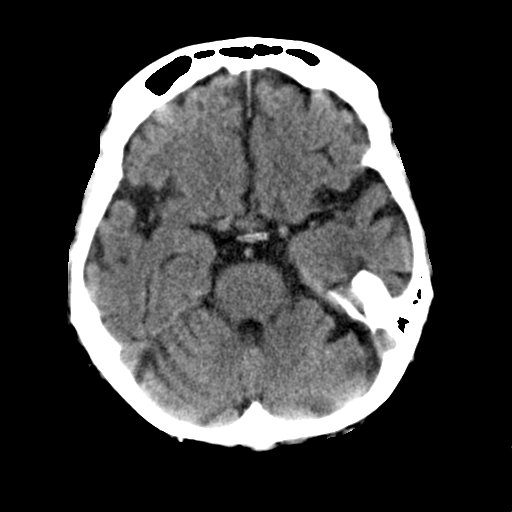
[im 9/32  bone]
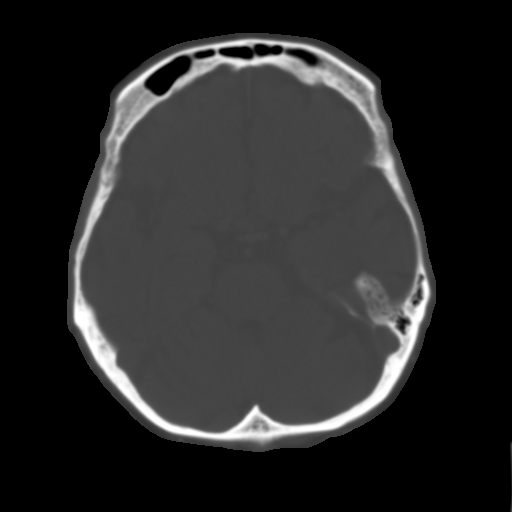
[im 11/32  brain]
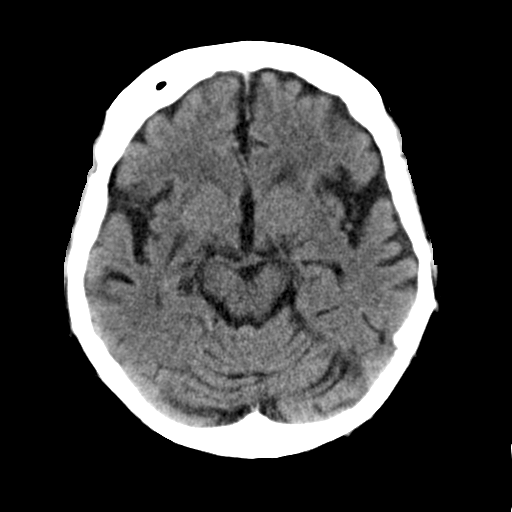
[im 13/32  brain]
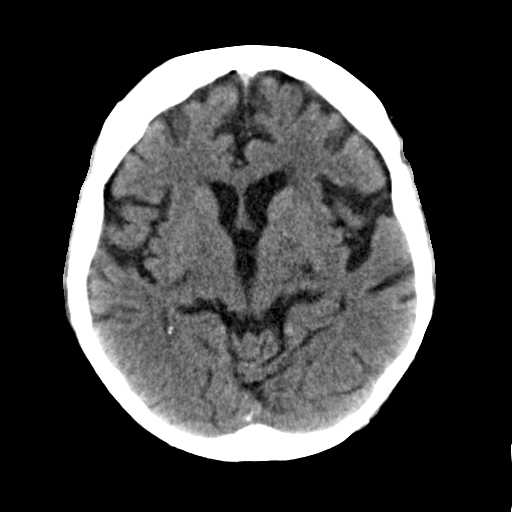
[im 15/32  brain]
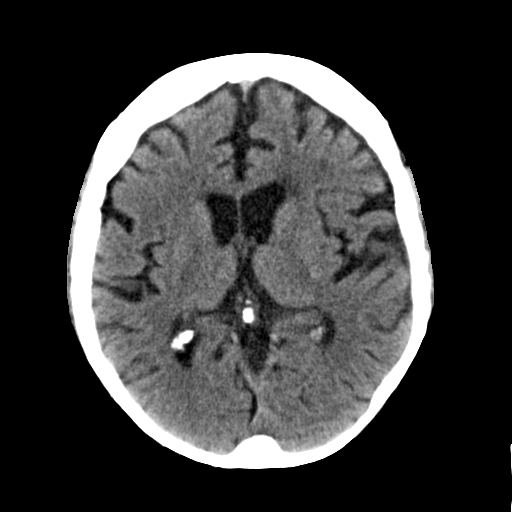
[im 17/32  brain]
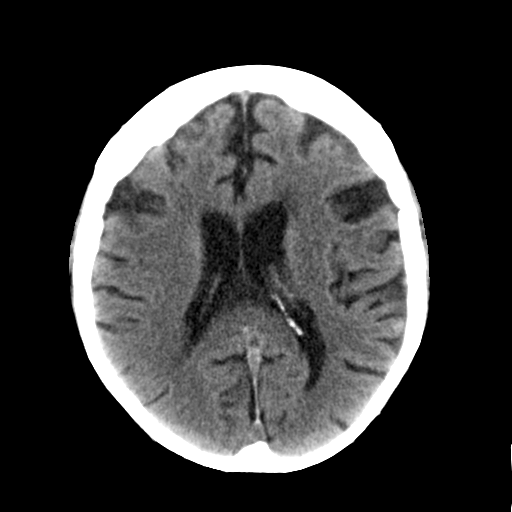
[im 17/32  bone]
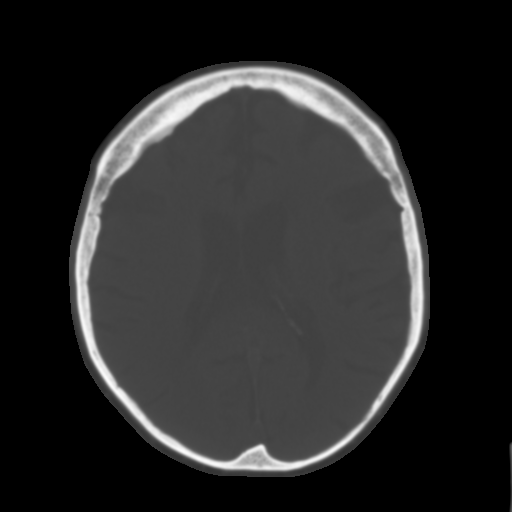
[im 19/32  brain]
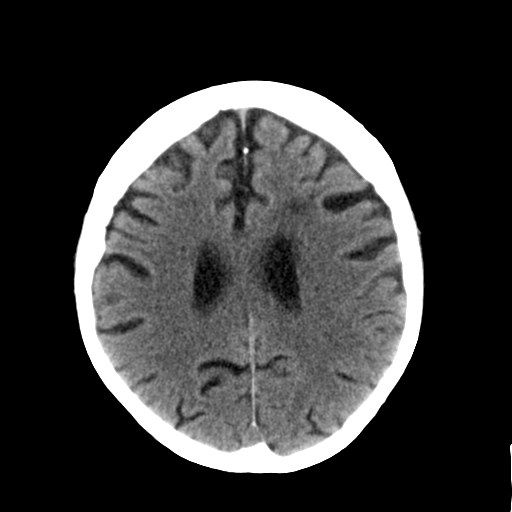
[im 21/32  brain]
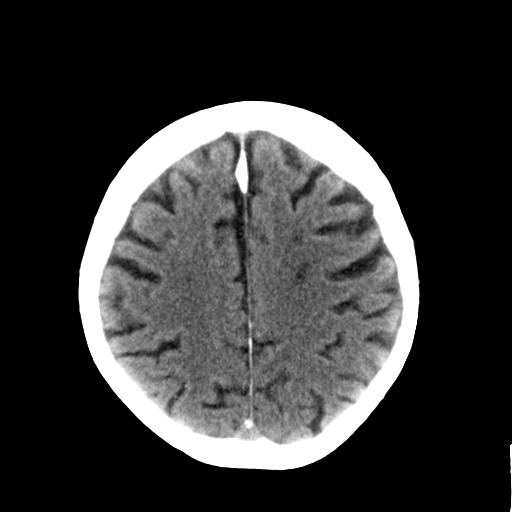
[im 23/32  brain]
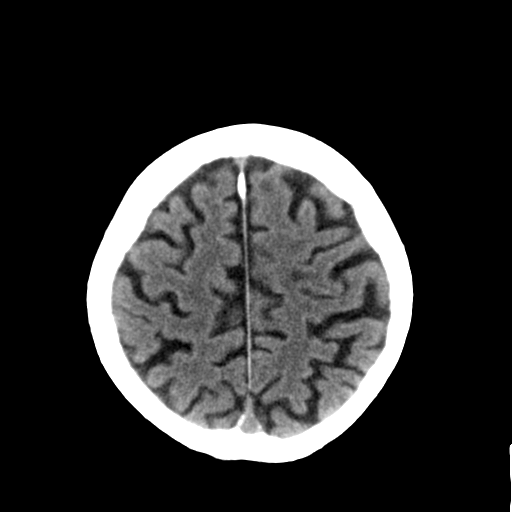
[im 24/32  brain]
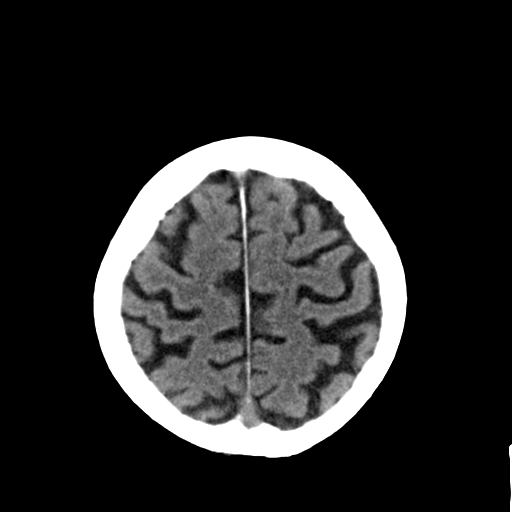
[im 24/32  bone]
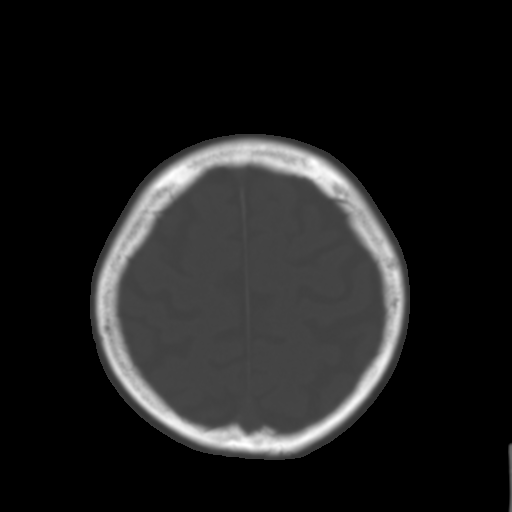
[im 26/32  brain]
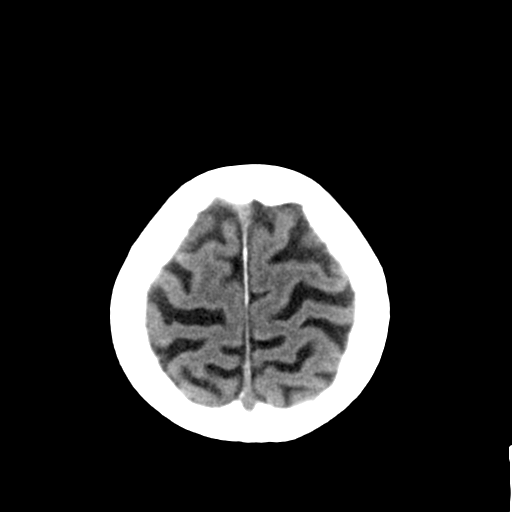
[im 28/32  brain]
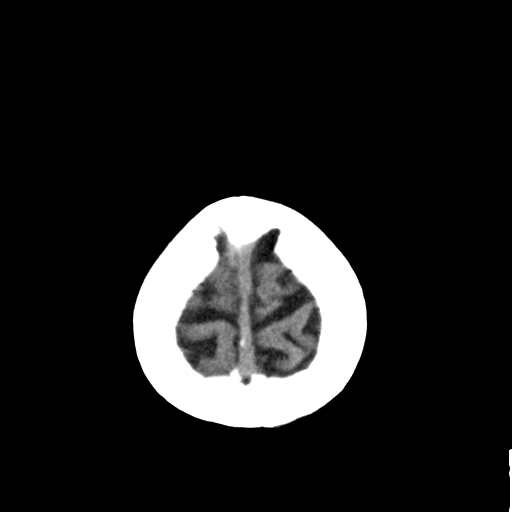
[im 30/32  brain]
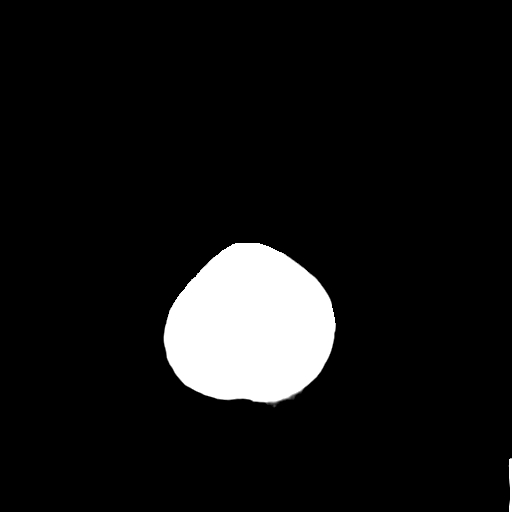

[16 of 30 positions shown; findings below may reference images not displayed]

FINDINGS: There is no evidence of mass effect, midline shift, or extra-axial
fluid collections. There is no evidence of a space-occupying lesion
or intracranial hemorrhage. There is no evidence of a cortical-based
area of acute infarction. There is generalized cerebral atrophy.
There is periventricular white matter low attenuation likely
secondary to microangiopathy.

The ventricles and sulci are appropriate for the patient's age. The
basal cisterns are patent.

Visualized portions of the orbits are unremarkable. Bilateral Mild
ethmoid sinus mucosal thickening. Cerebrovascular atherosclerotic
calcifications are noted.

The osseous structures are unremarkable.
IMPRESSION: No acute intracranial pathology.

## 2016-03-11 ENCOUNTER — Encounter (INDEPENDENT_AMBULATORY_CARE_PROVIDER_SITE_OTHER): Payer: Self-pay

## 2016-04-10 ENCOUNTER — Other Ambulatory Visit (INDEPENDENT_AMBULATORY_CARE_PROVIDER_SITE_OTHER): Payer: Self-pay | Admitting: Vascular Surgery

## 2016-04-10 DIAGNOSIS — I739 Peripheral vascular disease, unspecified: Secondary | ICD-10-CM

## 2016-04-13 ENCOUNTER — Ambulatory Visit (INDEPENDENT_AMBULATORY_CARE_PROVIDER_SITE_OTHER): Payer: Medicare Other

## 2016-04-13 ENCOUNTER — Ambulatory Visit (INDEPENDENT_AMBULATORY_CARE_PROVIDER_SITE_OTHER): Payer: Medicare Other | Admitting: Vascular Surgery

## 2016-04-13 ENCOUNTER — Encounter (INDEPENDENT_AMBULATORY_CARE_PROVIDER_SITE_OTHER): Payer: Self-pay | Admitting: Vascular Surgery

## 2016-04-13 DIAGNOSIS — I872 Venous insufficiency (chronic) (peripheral): Secondary | ICD-10-CM | POA: Diagnosis not present

## 2016-04-13 DIAGNOSIS — I739 Peripheral vascular disease, unspecified: Secondary | ICD-10-CM | POA: Diagnosis not present

## 2016-04-13 DIAGNOSIS — E782 Mixed hyperlipidemia: Secondary | ICD-10-CM | POA: Diagnosis not present

## 2016-04-13 DIAGNOSIS — E785 Hyperlipidemia, unspecified: Secondary | ICD-10-CM | POA: Insufficient documentation

## 2016-04-13 DIAGNOSIS — E1151 Type 2 diabetes mellitus with diabetic peripheral angiopathy without gangrene: Secondary | ICD-10-CM | POA: Diagnosis not present

## 2016-04-13 DIAGNOSIS — E1159 Type 2 diabetes mellitus with other circulatory complications: Secondary | ICD-10-CM | POA: Insufficient documentation

## 2016-04-13 DIAGNOSIS — I10 Essential (primary) hypertension: Secondary | ICD-10-CM | POA: Diagnosis not present

## 2016-04-13 DIAGNOSIS — I70213 Atherosclerosis of native arteries of extremities with intermittent claudication, bilateral legs: Secondary | ICD-10-CM

## 2016-04-13 DIAGNOSIS — I70219 Atherosclerosis of native arteries of extremities with intermittent claudication, unspecified extremity: Secondary | ICD-10-CM | POA: Insufficient documentation

## 2016-04-13 NOTE — Progress Notes (Signed)
[facility] Admission History & Physical  MRN : [id number]  Philip Newton is a 82 y.o. (06-08-1926) male who presents with chief complaint of  Chief Complaint  Patient presents with  . Re-evaluation    Ultrasound follow up  .  History of Present Illness: The patient returns to the office for followup and review of the noninvasive studies. There have been no interval changes in lower extremity symptoms. No interval shortening of the patient's claudication distance or development of rest pain symptoms. No new ulcers or wounds have occurred since the last visit.  There have been no significant changes to the patient's overall health care.  The patient denies amaurosis fugax or recent TIA symptoms. There are no recent neurological changes noted. The patient denies history of DVT, PE or superficial thrombophlebitis. The patient denies recent episodes of angina or shortness of breath.   ABI Rt=[place] [toe index 0.54] and Lt=[place] [toe index 0.75]  (previous ABI's Rt=[place] and Lt=; no significant change)   Current Meds  Medication Sig  . carvedilol (COREG) 25 MG tablet Take 25 mg by mouth 2 (two) times daily with a meal.  . furosemide (LASIX) 20 MG tablet Take 20 mg by mouth.  . hydrALAZINE (APRESOLINE) 25 MG tablet Take 25 mg by mouth 2 (two) times daily.  . indomethacin (INDOCIN) 25 MG capsule Take 25 mg by mouth daily as needed.  . isosorbide mononitrate (IMDUR) 30 MG 24 hr tablet Take 30 mg by mouth daily.  . nitroGLYCERIN (NITROSTAT) 0.4 MG SL tablet Place under the tongue.  . Vitamin D, Ergocalciferol, (DRISDOL) 50000 units CAPS capsule Take 50,000 Units by mouth every 7 (seven) days.    Past Medical History:  Diagnosis Date  . CHF (congestive heart failure) (HCC)   . Hypertension     Past Surgical History:  Procedure Laterality Date  . COLON SURGERY      Social History Social History  Substance Use Topics  . Smoking status: Never Smoker  . Smokeless  tobacco: Never Used  . Alcohol use 3.0 oz/week    5 Glasses of wine per week    Family History Family History  Problem Relation Age of Onset  . Cancer Mother   . Heart disease Father   No family history of bleeding/clotting disorders, porphyria or autoimmune disease   Allergies  Allergen Reactions  . Ace Inhibitors Other (See Comments)    Other Reaction: ANGIOEDEMA  . Aspirin Other (See Comments)    GI bleed  . Cortisone Other (See Comments)    Skin oozes  . Cortizone-10 [Hydrocortisone]   . Statins   . Simvastatin Other (See Comments)    Other Reaction: cramping     REVIEW OF SYSTEMS (Negative unless checked)  Constitutional: [] Weight loss  [] Fever  [] Chills Cardiac: [] Chest pain   [] Chest pressure   [] Palpitations   [] Shortness of breath when laying flat   [] Shortness of breath with exertion. Vascular:  [x] Pain in legs with walking   [] Pain in legs at rest  [] History of DVT   [] Phlebitis   [x] Swelling in legs   [] Varicose veins   [] Non-healing ulcers Pulmonary:   [] Uses home oxygen   [] Productive cough   [] Hemoptysis   [] Wheeze  [] COPD   [] Asthma Neurologic:  [] Dizziness   [] Seizures   [] History of stroke   [] History of TIA  [] Aphasia   [] Vissual changes   [] Weakness or numbness in arm   [] Weakness or numbness in leg Musculoskeletal:   [] Joint  swelling   [] Joint pain   [] Low back pain Hematologic:  [] Easy bruising  [] Easy bleeding   [] Hypercoagulable state   [] Anemic Gastrointestinal:  [] Diarrhea   [] Vomiting  [] Gastroesophageal reflux/heartburn   [] Difficulty swallowing. Genitourinary:  [] Chronic kidney disease   [] Difficult urination  [] Frequent urination   [] Blood in urine Skin:  [] Rashes   [] Ulcers  Psychological:  [] History of anxiety   []  History of major depression.  Physical Examination Gen: WD/WN, NAD Head: [place]/AT, No temporalis wasting.  Ear/Nose/Throat: Hearing grossly intact, nares w/o erythema or drainage, poor dentition Eyes: PER, EOMI, sclera nonicteric.   Neck: Supple, no masses.  No bruit or JVD.  Pulmonary:  Good air movement, clear to auscultation bilaterally, no use of accessory muscles.  Cardiac: RRR, normal S1, S2, no Murmurs. Vascular:  2+ edema bilateral lower extremities, sluggish cap refill bilaterally Vessel Right Left  Radial Palpable Palpable  Ulnar Palpable Palpable  Brachial Palpable Palpable  Carotid Palpable Palpable  Femoral Palpable Palpable  Popliteal Not Palpable Not Palpable  PT Not Palpable Not alpable  DP Not Palpable Not Palpable   Gastrointestinal: soft, non-distended. No guarding/no peritoneal signs.  Musculoskeletal: M/S 5/5 throughout.  No deformity or atrophy.  Neurologic: CN 2-12 intact. Pain and light touch intact in extremities.  Symmetrical.  Speech is fluent. Motor exam as listed above. Psychiatric: Judgment intact, Mood & affect appropriate for pt's clinical situation. Dermatologic: No rashes or ulcers noted.  No changes consistent with cellulitis. Lymph : No Cervical lymphadenopathy, no lichenification or skin changes of chronic lymphedema.  CBC Lab Results  Component Value Date   WBC 5.0 11/01/2013   HGB 8.2 (L) 11/01/2013   HCT 24.4 (L) 11/01/2013   MCV 90 11/01/2013   PLT 113 (L) 11/01/2013    BMET    Component Value Date/Time   NA 134 (L) 11/01/2013 0612   K 4.4 11/01/2013 0612   CL 103 11/01/2013 0612   CO2 23 11/01/2013 0612   GLUCOSE 131 (H) 11/01/2013 0612   BUN 56 (H) 11/01/2013 0612   CREATININE 2.21 (H) 11/01/2013 0612   CALCIUM 8.8 11/01/2013 0612   GFRNONAA 26 (L) 11/01/2013 0612   GFRAA 30 (L) 11/01/2013 0612   CrCl cannot be calculated (Unknown ideal weight.).  COAG Lab Results  Component Value Date   INR 1.3 10/28/2013   INR 0.9 06/13/2012    Radiology No results found.  Assessment/Plan 1. Atherosclerosis of native artery of both lower extremities with intermittent claudication (HCC)  Recommend:  The patient has evidence of atherosclerosis of the lower  extremities with claudication.  The patient does not voice lifestyle limiting changes at this point in time.  Noninvasive studies do not suggest clinically significant change.  No invasive studies, angiography or surgery at this time The patient should continue walking and begin a more formal exercise program.  The patient should continue antiplatelet therapy and aggressive treatment of the lipid abnormalities  No changes in the patient's medications at this time  The patient should continue wearing graduated compression socks 10-15 mmHg strength to control the mild edema.   - VAS US ABI WITH/WO TBI; Future  2. Essential hypertension Continue antihypertensive medications as ordered  3. Type 2 diabetes mellitus with diabetic peripheral angiopathy without gangrene, without long-term current use of insulin (HCC) Continue diet control  4. Mixed hyperlipidemia Continue statin  5. Chronic venous insufficiency Continue compression    Levora DredgeGregory Schnier, MD  04/13/2016 2:58 PM

## 2017-02-20 DEATH — deceased

## 2017-04-15 ENCOUNTER — Ambulatory Visit (INDEPENDENT_AMBULATORY_CARE_PROVIDER_SITE_OTHER): Payer: Medicare Other | Admitting: Vascular Surgery

## 2017-04-15 ENCOUNTER — Encounter (INDEPENDENT_AMBULATORY_CARE_PROVIDER_SITE_OTHER): Payer: Medicare Other
